# Patient Record
Sex: Female | Born: 1995 | Race: Black or African American | Hispanic: No | Marital: Single
Health system: Southern US, Community
[De-identification: ages and names within clinical notes are randomized; demographics above are authoritative.]

---

## 2017-10-03 ENCOUNTER — Emergency Department (HOSPITAL_COMMUNITY)
Admission: EM | Admit: 2017-10-03 | Discharge: 2017-10-03 | Disposition: A | Discharge status: Home / Self Care | Payer: Federal, State, Local not specified - PPO | Attending: Emergency Medicine | Admitting: Emergency Medicine

## 2017-10-03 ENCOUNTER — Emergency Department (HOSPITAL_COMMUNITY): Payer: Federal, State, Local not specified - PPO

## 2017-10-03 ENCOUNTER — Encounter (HOSPITAL_COMMUNITY): Payer: Self-pay | Admitting: *Deleted

## 2017-10-03 DIAGNOSIS — R1084 Generalized abdominal pain: Secondary | ICD-10-CM | POA: Diagnosis present

## 2017-10-03 DIAGNOSIS — K5900 Constipation, unspecified: Secondary | ICD-10-CM | POA: Diagnosis not present

## 2017-10-03 LAB — POC URINE PREG, ED: PREG TEST UR: NEGATIVE

## 2017-10-03 MED ORDER — MILK AND MOLASSES ENEMA
1.0000 | Freq: Once | RECTAL | Status: AC
  Administered 2017-10-03: 250 mL via RECTAL
  Filled 2017-10-03: qty 250

## 2017-10-03 MED ORDER — POLYETHYLENE GLYCOL 3350 17 G PO PACK
17.0000 g | PACK | Freq: Once | ORAL | Status: AC
  Administered 2017-10-03: 17 g via ORAL
  Filled 2017-10-03: qty 1

## 2017-10-03 NOTE — ED Triage Notes (Signed)
To ED for eval of abd pain. No BM for past 5 days. No hx of same. No diet change. States she hasn't been drinking much water

## 2017-10-03 NOTE — Discharge Instructions (Signed)
One cap of Miralax mixed in a glass of water 1-2 times daily until bowel movements are regular. An over the counter stool softener such as Colace or Senokat S can also be used.  Follow up with your primary physician in regard's to today's visit. Return to ER for vomiting, new or worsening symptoms, any additional concerns, persistent constipation, blood in her stool, vomiting, fevers, worsening abdominal pain.  Follow up with your primary care doctor or a gi doctor. Have given you a referral.   GETTING TO GOOD BOWEL HEALTH.     The goal: ONE SOFT BOWEL MOVEMENT A DAY!  To have soft, regular bowel movements:  Drink at least 8 tall glasses of water a day.   Take plenty of fiber.  Fiber is the undigested part of plant food that passes into the colon, acting s ?natures broom? to encourage bowel motility and movement.  Fiber can absorb and hold large amounts of water. This results in a larger, bulkier stool, which is soft and easier to pass. Work gradually over several weeks up to 6 servings a day of fiber (25g a day even more if needed) in the form of: Vegetables -- Root (potatoes, carrots, turnips), leafy green (lettuce, salad greens, celery, spinach), or cooked high residue (cabbage, broccoli, etc) Fruit -- Fresh (unpeeled skin & pulp), Dried (prunes, apricots, cherries, etc ),  or stewed ( applesauce)  Whole grain breads, pasta, etc (whole wheat)  Bran cereals  No reading or other relaxing activity while on the toilet. If bowel movements take longer than 5 minutes, you are too constipated

## 2017-10-03 NOTE — ED Provider Notes (Signed)
[facility] EMERGENCY DEPARTMENT Provider Note   CSN: [id number] Arrival date & time: 10/03/17  1619     History   Chief Complaint Chief Complaint  Patient presents with  . Abdominal Pain    HPI Sara Gray is a 21 y.o. female.  HPI 21 year old African-American female with no significant past medical history presents to the emergency department today with complaints of constipation and generalized abdominal pain. Patient states that she feels like she is constipated. Her last bowel movement was 5 days ago. She does report passing fluctuance. Patient states that her last bowel movement was hard in nature. She denies any associated melena or hematochezia. She has no history of same. States that she has not been drinking as much water as usual. Denies any associated urinary symptoms, vaginal symptoms, emesis. Does report some mild nausea. Denies any associated fever.  Pt denies any fever, chill, ha, vision changes, lightheadedness, dizziness, congestion, neck pain, cp, sob, cough, v/d, urinary symptoms, melena, hematochezia, lower extremity paresthesias.  History reviewed. No pertinent past medical history.  There are no active problems to display for this patient.   History reviewed. No pertinent surgical history.  OB History    No data available       Home Medications    Prior to Admission medications   Not on File    Family History No family history on file.  Social History Social History  Substance Use Topics  . Smoking status: Not on file  . Smokeless tobacco: Not on file  . Alcohol use Not on file     Allergies   Patient has no known allergies.   Review of Systems Review of Systems  Constitutional: Negative for chills and fever.  HENT: Negative for congestion.   Eyes: Negative for visual disturbance.  Respiratory: Negative for cough and shortness of breath.   Cardiovascular: Negative for chest pain.  Gastrointestinal: Positive  for abdominal pain (generalized), constipation and nausea. Negative for abdominal distention, blood in stool, diarrhea and vomiting.  Genitourinary: Negative for dysuria, flank pain, frequency, hematuria, urgency, vaginal bleeding and vaginal discharge.  Musculoskeletal: Negative for arthralgias and myalgias.  Skin: Negative for rash.  Neurological: Negative for dizziness, syncope, weakness, light-headedness, numbness and headaches.  Psychiatric/Behavioral: Negative for sleep disturbance. The patient is not nervous/anxious.      Physical Exam Updated Vital Signs BP 110/73 (BP Location: Right Arm)   Pulse 92   Temp 97.9 F (36.6 C) (Oral)   Resp 16   Ht  (1.549 m)   Wt 50.8 kg (112 lb)   LMP 09/19/2017   SpO2 99%   BMI 21.16 kg/m   Physical Exam  Constitutional: She is oriented to person, place, and time. She appears well-developed and well-nourished.  Non-toxic appearance. No distress.  HENT:  Head: Normocephalic and atraumatic.  Nose: Nose normal.  Mouth/Throat: Oropharynx is clear and moist.  Eyes: Pupils are equal, round, and reactive to light. Conjunctivae are normal. Right eye exhibits no discharge. Left eye exhibits no discharge.  Neck: Normal range of motion. Neck supple.  Cardiovascular: Normal rate, regular rhythm, normal heart sounds and intact distal pulses.  Exam reveals no gallop and no friction rub.   No murmur heard. Pulmonary/Chest: Effort normal and breath sounds normal. No respiratory distress. She has no wheezes. She has no rales. She exhibits no tenderness.  Abdominal: Soft. Bowel sounds are decreased. There is no tenderness. There is no rigidity, no rebound, no guarding, no CVA tenderness, no tenderness  at McBurney's point and negative Murphy's sign.  Genitourinary:  Genitourinary Comments: Chaperone present for exam. Pt tolerated without difficulty. No external hemorrhoids or fissures noted. No pain with palpation of the rectal vault. No internal  hemorrhoids noted. Large amount of hard stool noted in the rectal vault. No gross hematochezia or melena.   Musculoskeletal: Normal range of motion. She exhibits no tenderness.  Lymphadenopathy:    She has no cervical adenopathy.  Neurological: She is alert and oriented to person, place, and time.  Skin: Skin is warm and dry. Capillary refill takes less than 2 seconds.  Psychiatric: Her behavior is normal. Judgment and thought content normal.  Nursing note and vitals reviewed.    ED Treatments / Results  Labs (all labs ordered are listed, but only abnormal results are displayed) Labs Reviewed  POC URINE PREG, ED    EKG  EKG Interpretation None       Radiology Dg Abdomen 1 View  Result Date: 10/03/2017 CLINICAL DATA:  Constipation. EXAM: ABDOMEN - 1 VIEW COMPARISON:  None FINDINGS: Gas is identified within normal caliber large and small bowel loops. There is a large stool burden identified within the rectum which has a diameter of 10.3 cm. IMPRESSION: 1. Large stool burden identified within the rectum compatible with the clinical history of constipation. Electronically Signed   By: Signa Kell M.D.   On: 10/03/2017 19:43    Procedures Fecal disimpaction Date/Time: 10/03/2017 10:41 PM Performed by: Demetrios Loll T Authorized by: Demetrios Loll T  Consent: Verbal consent obtained. Risks and benefits: risks, benefits and alternatives were discussed Consent given by: patient Patient understanding: patient states understanding of the procedure being performed Imaging studies: imaging studies available Patient identity confirmed: verbally with patient Patient tolerance: Patient tolerated the procedure well with no immediate complications Comments: Sig amount of hard stool was noted in the rectal vault and sign amount was removed with disimpaction.    (including critical care time)  Medications Ordered in ED Medications  milk and molasses enema (250 mLs Rectal  Given 10/03/17 2000)  polyethylene glycol (MIRALAX / GLYCOLAX) packet 17 g (17 g Oral Given 10/03/17 2100)     Initial Impression / Assessment and Plan / ED Course  I have reviewed the triage vital signs and the nursing notes.  Pertinent labs & imaging results that were available during my care of the patient were reviewed by me and considered in my medical decision making (see chart for details).     Patient presents to the ED with complaints of constipation and generalized abdominal pain. States her last bowel movement was 5 days ago. No history of same.  Vital signs are very reassuring. Patient is afebrile.  On exam patient has no focal abdominal tenderness.  KUB was ordered that showed a 10 cm large stool present in the rectum.  Rectal exam was performed with a large amount of hard stool noted in the rectal vault. Digital disimpaction was performed. Significant amount of hard stool was removed.  Patient given a milk and molasses enema and had a significant bowel movement. Patient states that she feels very much improved. Repeat abdominal exam is benign. Discussed symptomatically treatment at home with patient. Also encouraged patient to drink plenty of fluids. Given the patient has no smoking history of constipation have given her referral to GI doctor. Do not feel that further imaging is indicated at this time.  Pt is hemodynamically stable, in NAD, & able to ambulate in the ED. Evaluation does not  show pathology that would require ongoing emergent intervention or inpatient treatment. I explained the diagnosis to the patient. Pain has been managed & has no complaints prior to dc. Pt is comfortable with above plan and is stable for discharge at this time. All questions were answered prior to disposition. Strict return precautions for f/u to the ED were discussed. Encouraged follow up with PCP.  Dicussed with Dr. Lynelle Doctor who is agreeable with the above plan.   Final Clinical  Impressions(s) / ED Diagnoses   Final diagnoses:  Constipation, unspecified constipation type    New Prescriptions Discharge Medication List as of 10/03/2017  9:15 PM       Rise Mu, PA-C 10/03/17 2245    Linwood Dibbles, MD 10/03/17 [id number]

## 2018-07-23 ENCOUNTER — Other Ambulatory Visit: Payer: Self-pay | Admitting: Nurse Practitioner

## 2018-07-23 DIAGNOSIS — N926 Irregular menstruation, unspecified: Secondary | ICD-10-CM

## 2018-07-30 ENCOUNTER — Ambulatory Visit
Admission: RE | Admit: 2018-07-30 | Discharge: 2018-07-30 | Disposition: A | Discharge status: Home / Self Care | Payer: Federal, State, Local not specified - PPO | Source: Ambulatory Visit | Attending: Nurse Practitioner | Admitting: Nurse Practitioner

## 2018-07-30 DIAGNOSIS — N926 Irregular menstruation, unspecified: Secondary | ICD-10-CM

## 2020-01-02 IMAGING — US US PELVIS COMPLETE TRANSABD/TRANSVAG
1 series · 14 of 25 positions shown · non-contrast
Comparison: None

CLINICAL DATA: Irregular menstruation



[Series 1: us pelvis complete transabd/transvag · 0.15mm/px · 14 of 63 slices shown]
[im 1/63]
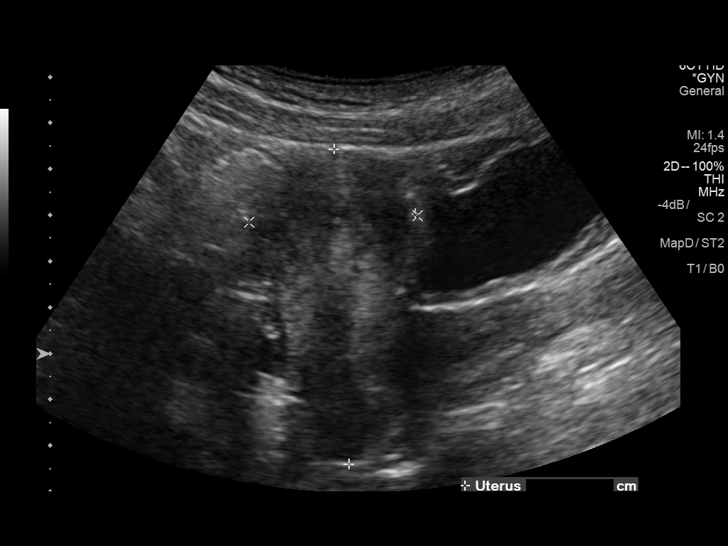
[im 6/63]
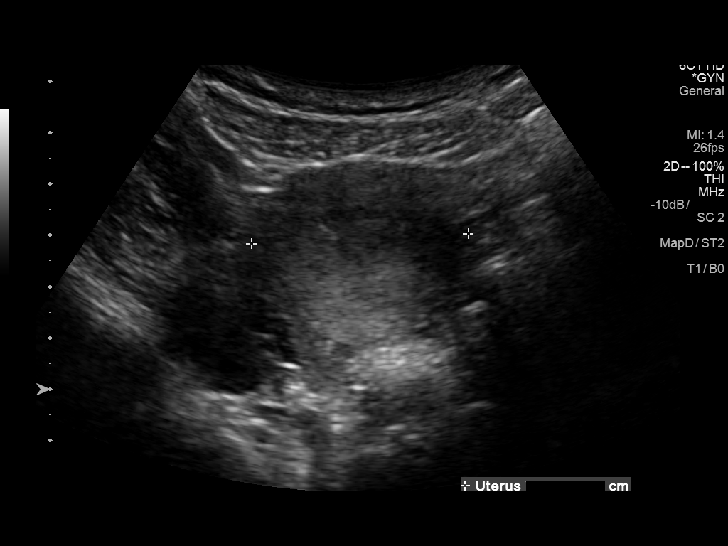
[im 11/63]
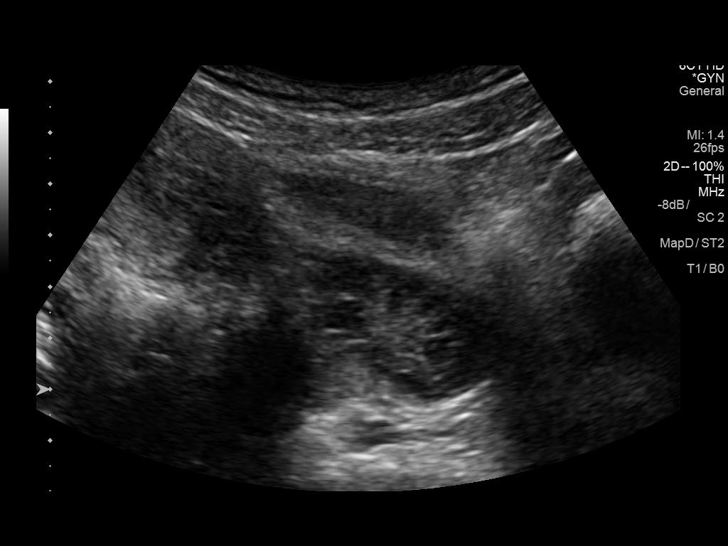
[im 16/63]
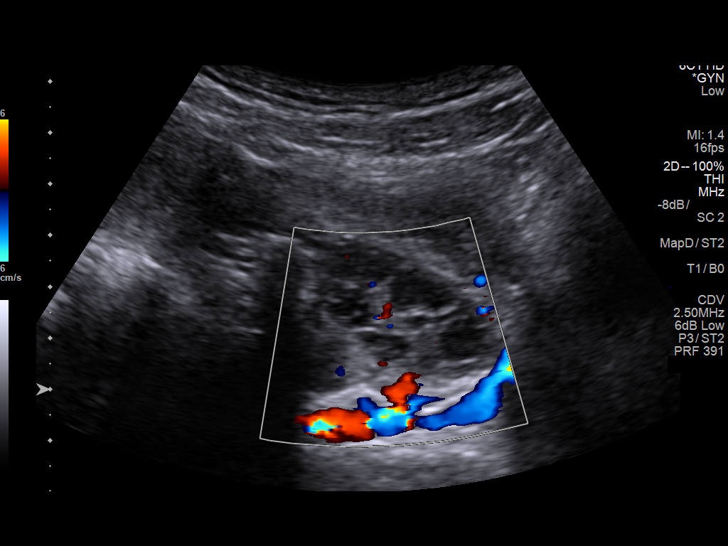
[im 21/63]
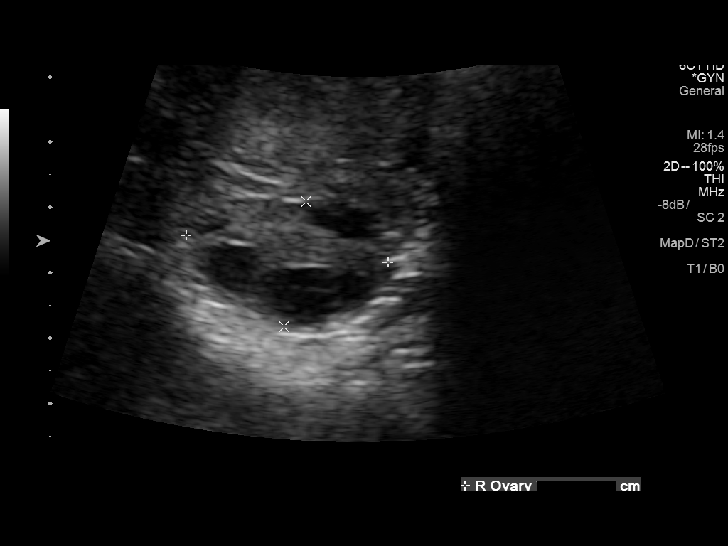
[im 24/63]
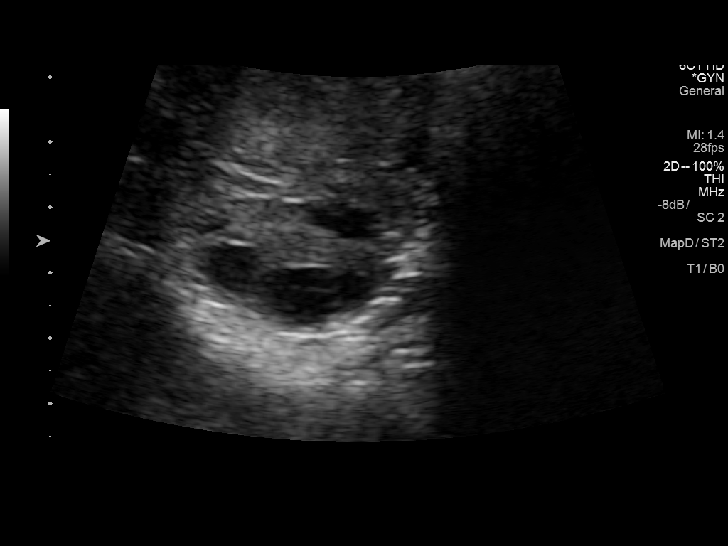
[im 29/63]
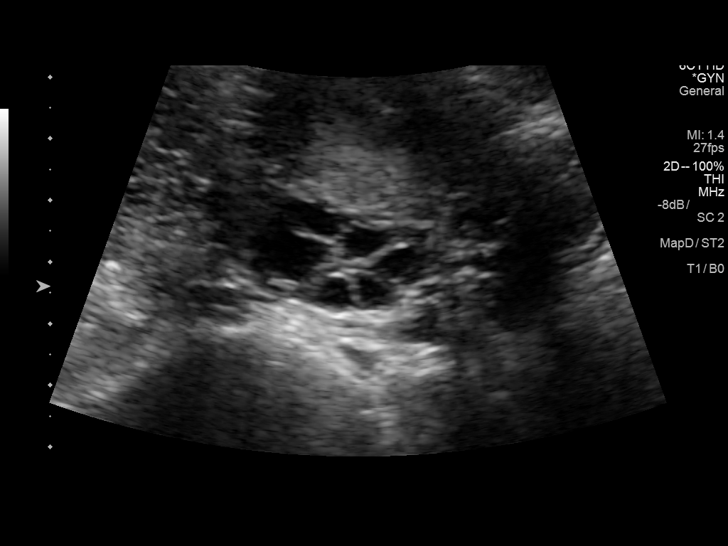
[im 34/63]
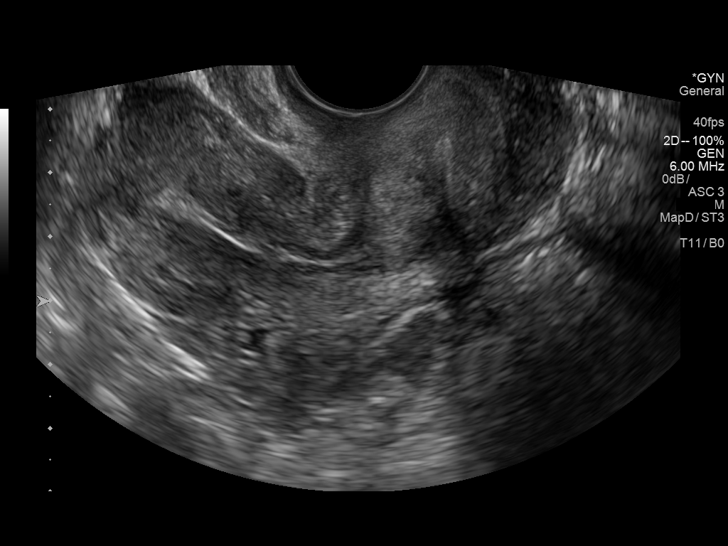
[im 39/63]
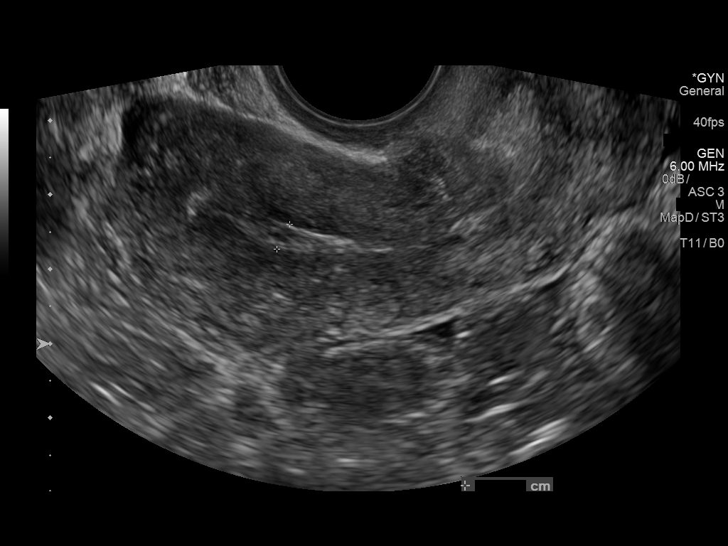
[im 42/63]
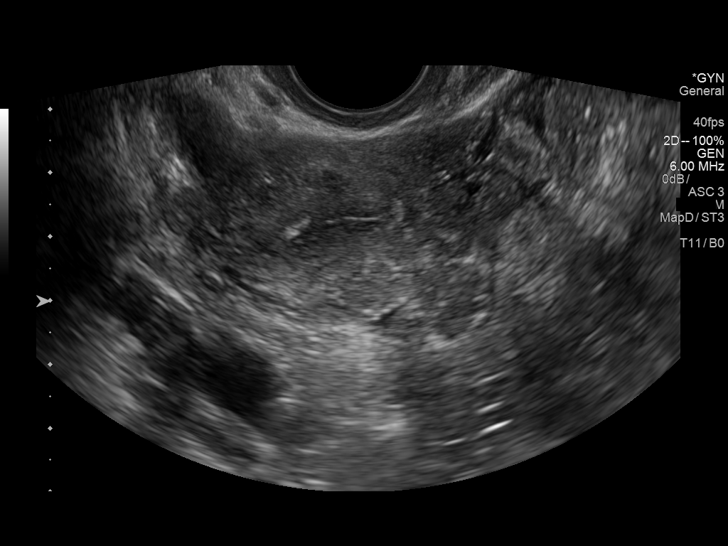
[im 47/63]
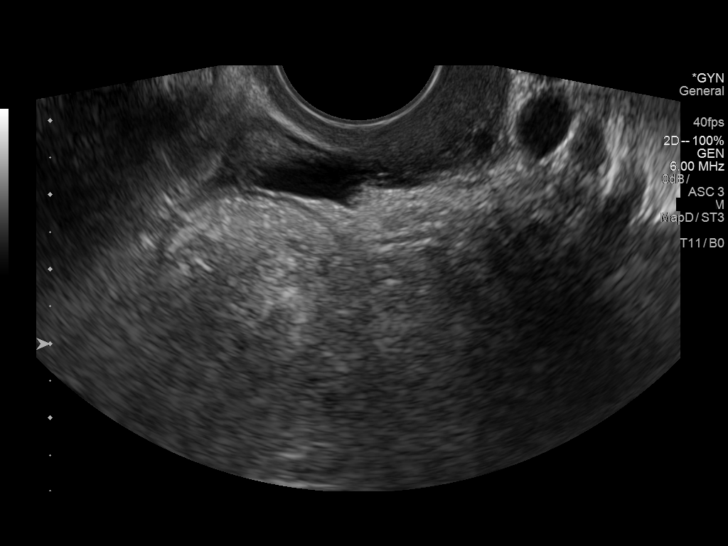
[im 52/63]
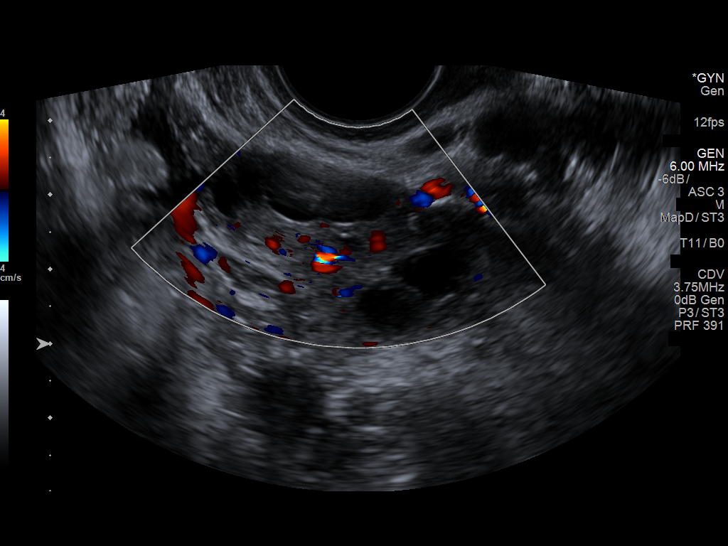
[im 57/63]
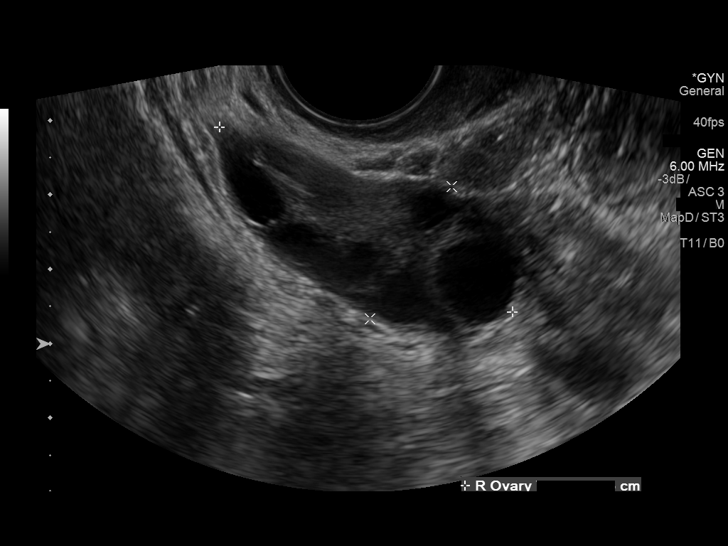
[im 63/63]
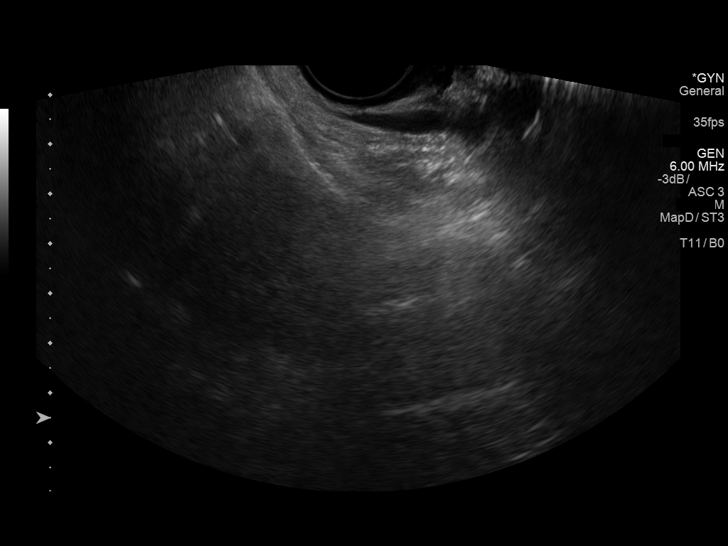

[14 of 25 positions shown; findings below may reference images not displayed]

FINDINGS: Uterus

Measurements: 7.5 x 3.4 x 4.1 cm. No fibroids or other mass
visualized.

Endometrium

Thickness: 3.8 mm.  No focal abnormality visualized.

Right ovary

Measurements: 3.2 x 2.1 x 4.7 cm. Normal appearance/no adnexal mass.

Left ovary

Measurements: 3.9 x 2.9 x 3.6 cm. Normal appearance/no adnexal mass.

Other findings

Trace free fluid is identified in the pelvis.
IMPRESSION: Normal pelvic ultrasound.

Trace free fluid identified in the pelvis, likely physiologic.

## 2020-10-27 ENCOUNTER — Ambulatory Visit: Payer: Self-pay | Admitting: *Deleted

## 2020-10-27 NOTE — Telephone Encounter (Signed)
C/o constipation x 7 days . 7 months pregnant. Has taken glycerin suppository, drank whole container of prune juice with minimal BM. Denies vomiting, abdominal pain, hemorrhoids. Reports drinking plenty of fluids. Care advise given. Encouraged patient to contact OBGYN tomorrow and pharmacist for more safe OTC options. Patient verbalized understanding of care advise and to call back or go to [facility] or ED if symptoms worsen.   Reason for Disposition  Last bowel movement (BM) > 4 days ago  Answer Assessment - Initial Assessment Questions 1. STOOL PATTERN OR FREQUENCY: "How often do you pass bowel movements (BMs)?"  (Normal range: tid to q 3 days)  "When was the last BM passed?"       About every 2 days . LBM 7 days ago  2. STRAINING: "Do you have to strain to have a BM?"      Yes a little  3. RECTAL PAIN: "Does your rectum hurt when the stool comes out?" If Yes, ask: "Do you have hemorrhoids? How bad is the pain?"  (Scale 1-10; or mild, moderate, severe)     Yes . No hemorrhoids 4. STOOL COMPOSITION: "Are the stools hard?"      hard 5. BLOOD ON STOOLS: "Has there been any blood on the toilet tissue or on the surface of the BM?" If Yes, ask: "When was the last time?"      no 6. CHRONIC CONSTIPATION: "Is this a new problem for you?"  If no, ask: "How long have you had this problem?" (days, weeks, months)      No . Have been constipated before  7. CHANGES IN DIET OR HYDRATION: "Have there been any recent changes in your diet?" "How much fluids are you drinking consuming on a daily basis?"  "How much have you had to drink today?"     No . Drinking enough fluids 8. MEDICATIONS: "Have you been taking any new medications?" "Are you taking any narcotic pain medications?" (e.g., Vicoden, Percocet, morphine, dilaudid)     No  Only prenatal vitamins 9. LAXATIVES: "Have you been using any stool softeners, laxatives, or enemas?"  If yes, ask "What, how often, and when was the last time?" 10.ACTIVITY:  "How much  walking do you do every day? on a daily basis?"  "Has your activity level decreased in the past week?"        No change in activity  11. CAUSE: "What do you think is causing the constipation?"        Not sure  12. OTHER SYMPTOMS: "Do you have any other symptoms?" (e.g., abdominal pain, bloating, fever, vomiting)       No  13. MEDICAL HISTORY: "Do you have a history of hemorrhoids, rectal fissures, or rectal surgery or rectal abscess?"         No  14. PREGNANCY: "Is there any chance you are pregnant?" "When was your last menstrual period?"       7 months pregnant  Protocols used: CONSTIPATION-A-AH
